# Patient Record
Sex: Female | Born: 1940 | Race: White | Hispanic: No | Marital: Married | Smoking: Former smoker
Health system: Southern US, Community
[De-identification: ages and names within clinical notes are randomized; demographics above are authoritative.]

## PROBLEM LIST (undated history)

## (undated) DIAGNOSIS — C55 Malignant neoplasm of uterus, part unspecified: Secondary | ICD-10-CM

## (undated) DIAGNOSIS — E079 Disorder of thyroid, unspecified: Secondary | ICD-10-CM

## (undated) DIAGNOSIS — C801 Malignant (primary) neoplasm, unspecified: Secondary | ICD-10-CM

## (undated) DIAGNOSIS — Z9225 Personal history of immunosupression therapy: Secondary | ICD-10-CM

## (undated) DIAGNOSIS — E785 Hyperlipidemia, unspecified: Secondary | ICD-10-CM

## (undated) DIAGNOSIS — C541 Malignant neoplasm of endometrium: Secondary | ICD-10-CM

## (undated) DIAGNOSIS — M810 Age-related osteoporosis without current pathological fracture: Secondary | ICD-10-CM

## (undated) DIAGNOSIS — I10 Essential (primary) hypertension: Secondary | ICD-10-CM

## (undated) HISTORY — PX: OOPHORECTOMY: SHX86

## (undated) HISTORY — DX: Disorder of thyroid, unspecified: E07.9

## (undated) HISTORY — DX: Malignant neoplasm of uterus, part unspecified: C55

## (undated) HISTORY — DX: Essential (primary) hypertension: I10

## (undated) HISTORY — DX: Personal history of immunosuppression therapy: Z92.25

## (undated) HISTORY — PX: ABDOMINAL HYSTERECTOMY: SHX81

## (undated) HISTORY — DX: Malignant neoplasm of endometrium: C54.1

## (undated) HISTORY — DX: Age-related osteoporosis without current pathological fracture: M81.0

## (undated) HISTORY — DX: Malignant (primary) neoplasm, unspecified: C80.1

## (undated) HISTORY — DX: Hyperlipidemia, unspecified: E78.5

---

## 2004-02-17 ENCOUNTER — Ambulatory Visit (HOSPITAL_COMMUNITY): Admission: RE | Admit: 2004-02-17 | Discharge: 2004-02-17 | Payer: Self-pay | Admitting: Gynecology

## 2004-03-13 ENCOUNTER — Ambulatory Visit: Admission: RE | Admit: 2004-03-13 | Discharge: 2004-03-13 | Payer: Self-pay | Admitting: Gynecology

## 2004-04-03 ENCOUNTER — Inpatient Hospital Stay (HOSPITAL_COMMUNITY): Admission: RE | Admit: 2004-04-03 | Discharge: 2004-04-06 | Payer: Self-pay | Admitting: Gynecology

## 2004-05-15 ENCOUNTER — Ambulatory Visit: Admission: RE | Admit: 2004-05-15 | Discharge: 2004-05-15 | Payer: Self-pay | Admitting: Gynecology

## 2004-11-23 ENCOUNTER — Ambulatory Visit: Admission: RE | Admit: 2004-11-23 | Discharge: 2004-11-23 | Payer: Self-pay | Admitting: Gynecology

## 2004-11-23 ENCOUNTER — Other Ambulatory Visit: Admission: RE | Admit: 2004-11-23 | Discharge: 2004-11-23 | Payer: Self-pay | Admitting: Gynecology

## 2005-01-23 ENCOUNTER — Ambulatory Visit: Payer: Self-pay | Admitting: Family Medicine

## 2005-06-28 ENCOUNTER — Ambulatory Visit: Admission: RE | Admit: 2005-06-28 | Discharge: 2005-06-28 | Payer: Self-pay | Admitting: Gynecology

## 2005-06-28 ENCOUNTER — Other Ambulatory Visit: Admission: RE | Admit: 2005-06-28 | Discharge: 2005-06-28 | Payer: Self-pay | Admitting: Gynecology

## 2005-10-25 ENCOUNTER — Inpatient Hospital Stay: Payer: Self-pay | Admitting: Internal Medicine

## 2005-10-25 ENCOUNTER — Other Ambulatory Visit: Payer: Self-pay

## 2005-11-04 ENCOUNTER — Ambulatory Visit: Payer: Self-pay | Admitting: Internal Medicine

## 2005-11-14 ENCOUNTER — Ambulatory Visit: Payer: Self-pay | Admitting: Internal Medicine

## 2005-12-09 ENCOUNTER — Ambulatory Visit: Payer: Self-pay | Admitting: Gastroenterology

## 2005-12-14 ENCOUNTER — Ambulatory Visit: Payer: Self-pay | Admitting: Internal Medicine

## 2006-01-14 ENCOUNTER — Ambulatory Visit: Payer: Self-pay | Admitting: Internal Medicine

## 2006-02-14 ENCOUNTER — Ambulatory Visit: Payer: Self-pay | Admitting: Internal Medicine

## 2006-03-21 ENCOUNTER — Ambulatory Visit: Payer: Self-pay | Admitting: Gastroenterology

## 2006-04-15 ENCOUNTER — Ambulatory Visit: Payer: Self-pay | Admitting: Gastroenterology

## 2007-04-08 ENCOUNTER — Ambulatory Visit: Payer: Self-pay | Admitting: Family Medicine

## 2008-03-31 ENCOUNTER — Ambulatory Visit: Payer: Self-pay | Admitting: Gastroenterology

## 2011-02-08 ENCOUNTER — Ambulatory Visit: Payer: Self-pay | Admitting: Gastroenterology

## 2011-07-26 DIAGNOSIS — I10 Essential (primary) hypertension: Secondary | ICD-10-CM | POA: Insufficient documentation

## 2011-07-26 DIAGNOSIS — M810 Age-related osteoporosis without current pathological fracture: Secondary | ICD-10-CM | POA: Insufficient documentation

## 2011-07-26 DIAGNOSIS — M069 Rheumatoid arthritis, unspecified: Secondary | ICD-10-CM | POA: Insufficient documentation

## 2011-07-26 DIAGNOSIS — K5090 Crohn's disease, unspecified, without complications: Secondary | ICD-10-CM | POA: Insufficient documentation

## 2011-07-26 DIAGNOSIS — E039 Hypothyroidism, unspecified: Secondary | ICD-10-CM | POA: Insufficient documentation

## 2011-07-26 DIAGNOSIS — E7800 Pure hypercholesterolemia, unspecified: Secondary | ICD-10-CM | POA: Insufficient documentation

## 2011-08-13 ENCOUNTER — Ambulatory Visit: Payer: Self-pay | Admitting: Family Medicine

## 2011-09-13 ENCOUNTER — Ambulatory Visit: Payer: Self-pay | Admitting: Gastroenterology

## 2011-10-03 ENCOUNTER — Other Ambulatory Visit: Payer: Self-pay | Admitting: Rheumatology

## 2011-10-29 ENCOUNTER — Ambulatory Visit: Payer: Self-pay | Admitting: Gastroenterology

## 2012-08-12 ENCOUNTER — Other Ambulatory Visit: Payer: Self-pay | Admitting: Gastroenterology

## 2012-08-12 DIAGNOSIS — K562 Volvulus: Secondary | ICD-10-CM

## 2012-08-26 ENCOUNTER — Ambulatory Visit
Admission: RE | Admit: 2012-08-26 | Discharge: 2012-08-26 | Disposition: A | Discharge status: Home / Self Care | Payer: Medicare Other | Source: Ambulatory Visit | Attending: Gastroenterology | Admitting: Gastroenterology

## 2012-08-26 DIAGNOSIS — K562 Volvulus: Secondary | ICD-10-CM

## 2012-08-31 DIAGNOSIS — Z9225 Personal history of immunosupression therapy: Secondary | ICD-10-CM | POA: Insufficient documentation

## 2012-08-31 DIAGNOSIS — R739 Hyperglycemia, unspecified: Secondary | ICD-10-CM | POA: Insufficient documentation

## 2012-08-31 DIAGNOSIS — N390 Urinary tract infection, site not specified: Secondary | ICD-10-CM | POA: Insufficient documentation

## 2012-08-31 DIAGNOSIS — R748 Abnormal levels of other serum enzymes: Secondary | ICD-10-CM | POA: Insufficient documentation

## 2013-07-12 DIAGNOSIS — K219 Gastro-esophageal reflux disease without esophagitis: Secondary | ICD-10-CM | POA: Insufficient documentation

## 2016-04-09 ENCOUNTER — Telehealth: Payer: Self-pay

## 2016-04-09 NOTE — Telephone Encounter (Signed)
lmov to schedule lbpu consultation appt per paper referral from [facility]

## 2016-05-15 ENCOUNTER — Encounter: Payer: Self-pay | Admitting: Internal Medicine

## 2016-05-15 ENCOUNTER — Other Ambulatory Visit: Payer: Self-pay | Admitting: *Deleted

## 2016-05-15 DIAGNOSIS — D510 Vitamin B12 deficiency anemia due to intrinsic factor deficiency: Secondary | ICD-10-CM | POA: Insufficient documentation

## 2016-05-15 DIAGNOSIS — K5790 Diverticulosis of intestine, part unspecified, without perforation or abscess without bleeding: Secondary | ICD-10-CM | POA: Insufficient documentation

## 2016-05-15 DIAGNOSIS — K259 Gastric ulcer, unspecified as acute or chronic, without hemorrhage or perforation: Secondary | ICD-10-CM | POA: Insufficient documentation

## 2016-05-15 DIAGNOSIS — K5090 Crohn's disease, unspecified, without complications: Secondary | ICD-10-CM | POA: Insufficient documentation

## 2016-05-15 DIAGNOSIS — G629 Polyneuropathy, unspecified: Secondary | ICD-10-CM | POA: Insufficient documentation

## 2016-05-15 DIAGNOSIS — E039 Hypothyroidism, unspecified: Secondary | ICD-10-CM | POA: Insufficient documentation

## 2016-05-15 DIAGNOSIS — M059 Rheumatoid arthritis with rheumatoid factor, unspecified: Secondary | ICD-10-CM | POA: Insufficient documentation

## 2016-05-20 ENCOUNTER — Ambulatory Visit (INDEPENDENT_AMBULATORY_CARE_PROVIDER_SITE_OTHER): Payer: Medicare HMO | Admitting: Internal Medicine

## 2016-05-20 ENCOUNTER — Encounter: Payer: Self-pay | Admitting: Internal Medicine

## 2016-05-20 ENCOUNTER — Ambulatory Visit
Admission: RE | Admit: 2016-05-20 | Discharge: 2016-05-20 | Disposition: A | Discharge status: Home / Self Care | Payer: Medicare HMO | Source: Ambulatory Visit | Attending: Internal Medicine | Admitting: Internal Medicine

## 2016-05-20 VITALS — BP 100/64 | HR 73 | Resp 16 | Ht 60.0 in | Wt 158.0 lb

## 2016-05-20 DIAGNOSIS — R0600 Dyspnea, unspecified: Secondary | ICD-10-CM

## 2016-05-20 DIAGNOSIS — R0689 Other abnormalities of breathing: Secondary | ICD-10-CM | POA: Diagnosis not present

## 2016-05-20 DIAGNOSIS — I700 Atherosclerosis of aorta: Secondary | ICD-10-CM | POA: Insufficient documentation

## 2016-05-20 MED ORDER — UMECLIDINIUM-VILANTEROL 62.5-25 MCG/INH IN AEPB: 1.0000 | INHALATION_SPRAY | Freq: Every day | RESPIRATORY_TRACT | 0 refills | Status: DC

## 2016-05-20 MED ORDER — UMECLIDINIUM-VILANTEROL 62.5-25 MCG/INH IN AEPB: 1.0000 | INHALATION_SPRAY | Freq: Every day | RESPIRATORY_TRACT | 5 refills | Status: DC

## 2016-05-20 NOTE — Patient Instructions (Addendum)
--  Will check CXR 2 view.  --Will check PFT.  --Will start Anoro inhaler --Check CBC with differential and basic metabolic panel.

## 2016-05-20 NOTE — Progress Notes (Signed)
[facility] Consultation      Assessment and Plan:  76 year old former smoking female with dyspnea on exertion of uncertain etiology.  Dyspnea on exertion. -Patient has scattered, very subtle crackles bilaterally, she has a history of rheumatoid arthritis on methotrexate, therefore, probably fibrosis would be in the differential. 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 -We'll start empirically on an oral inhaler once daily. -We'll check a lung function test, chest x-ray, CBC with differential. -The patient was ambulated in the clinic today, her gait seems stable, but uncertain, would consider referral to physical therapy if not improving. -Patient is very sedentary, would recommend increasing her physical activity level.  Date: 05/20/2016  MRN# [id number] Yvonne Newman 1940/01/16  Referring Physician: NP Dolleschel for dyspnea.   Yvonne Newman is a 76 y.o. old female seen in consultation for chief complaint of:    Chief Complaint  Patient presents with  . Advice Only    pt denies cough/wheeze/chest pain. She experiences sob with exhertion for about 1 year now.  . Shortness of Breath    HPI:   The patient is a 76 year old female referred for progressive dyspnea on exertion. She has been referred to pulmonary for further evaluation of her dyspnea. There are currently no images available for review, there is an report of an outside chest x-ray done on 10/25/2005, which showed some left lung base atelectasis, but otherwise unremarkable.  She has noted that she gets winded with stairs, or carrying groceries. This started about 1-2 years ago, it was actually worse in feb. There was no exacerbating or relieving factors.  She has arthritis in her feet and hands and has been diagnosed with RA.  She takes methotrexate, on the pills for about 20 years, and injection for about 2-3 years she thinks. She sees Dr.  Jefm Bryant.   She used to work in a [employer], for more than 30 years, and retired about 10 years ago. She has not been very active. She goes to [employer] and can walk the whole store but slowly.   She last smoked about 10 yrs ago, about half ppd. Her husband is a current smoker. She has a dog in bedroom, not in bed.   She has never been on an inhaler. She has occasional reflux about once per week, takes omeprazole prn, which takes of it. She has allergies and has stuffy nose usually in the am, she takes nothing for it, usually goes away during the day.   **Review of outside records at [facility]. The patient has been having progressive shortness of breath with activities of daily living, she has a history of hypertension, hyperlipidemia, hypothyroidism, uterine/endometrial cancer at the age of 45.  **Desat walk on RA at rest sat was 97% and HR 90; after 180 feet sat was 90% and HR 90.  After 350 feet sat was 94% and HR 107 on RA, mild dyspnea. Gait somewhat shuffling.    PMHX:   Past Medical History:  Diagnosis Date  . Cancer ([place])   . Endometrial cancer ([place])   . History of immunosuppression therapy   . Hyperlipidemia   . Hypertension   . Osteoporosis   . Thyroid disease   . Uterine cancer [facility])    Surgical Hx:  Past Surgical History:  Procedure Laterality Date  . ABDOMINAL HYSTERECTOMY    . CESAREAN SECTION    . OOPHORECTOMY     Family Hx:  Family History  Problem Relation Age of Onset  . Stroke Mother   . Melanoma Sister   . Breast cancer Sister    Social Hx:   Social History  Substance Use Topics  . Smoking status: Former Research scientist (life sciences)  . Smokeless tobacco: Never Used  . Alcohol use No   Medication:   Reviewed, includes omeprazole, methotrexate.   Allergies:  Ciprofloxacin; Penicillins; Statins; Cephalexin; Other; and Sulfa antibiotics  Review of Systems: Gen:  Denies  fever, sweats, chills HEENT: Denies blurred vision, double vision. bleeds, sore throat Cvc:  No  dizziness, chest pain. Resp:   Denies cough or sputum production, shortness of breath Gi: Denies swallowing difficulty, stomach pain. Gu:  Denies bladder incontinence, burning urine Ext:   No Joint pain, stiffness. Skin: No skin rash,  hives  Endoc:  No polyuria, polydipsia. Psych: No depression, insomnia. Other:  All other systems were reviewed with the patient and were negative other that what is mentioned in the HPI.   Physical Examination:   VS: BP 100/64 (BP Location: Left Arm, Patient Position: Sitting, Cuff Size: Normal)   Pulse 73   Resp 16   Ht 5' (1.524 m)   Wt 158 lb (71.7 kg)   SpO2 97%   BMI 30.86 kg/m   General Appearance: No distress  Neuro:without focal findings,  speech normal,  HEENT: PERRLA, EOM intact.   Pulmonary: normal breath sounds, No wheezing. Scattered bibasilar creps.  CardiovascularNormal S1,S2.  No m/r/g.   Abdomen: Benign, Soft, non-tender. Renal:  No costovertebral tenderness  GU:  No performed at this time. Endoc: No evident thyromegaly, no signs of acromegaly. Skin:   warm, no rashes, no ecchymosis  Extremities: normal, no cyanosis, clubbing.  Other findings:    LABORATORY PANEL:   CBC No results for input(s): WBC, HGB, HCT, PLT in the last 168 hours. ------------------------------------------------------------------------------------------------------------------  Chemistries  No results for input(s): NA, K, CL, CO2, GLUCOSE, BUN, CREATININE, CALCIUM, MG, AST, ALT, ALKPHOS, BILITOT in the last 168 hours.  Invalid input(s): GFRCGP ------------------------------------------------------------------------------------------------------------------  Cardiac Enzymes No results for input(s): TROPONINI in the last 168 hours. ------------------------------------------------------------  RADIOLOGY:  No results found.     Thank  you for the consultation and for allowing [facility] to assist in the care of your  patient. Our recommendations are noted above.  Please contact us if we can be of further service.   Marda Stalker, MD.  Board Certified in Internal Medicine, Pulmonary Medicine, [place], and Sleep Medicine.  [facility] Office Number: [phone]  Patricia Pesa, M.D.  Merton Border, M.D  05/20/2016

## 2016-09-03 ENCOUNTER — Ambulatory Visit: Payer: Medicare HMO | Attending: Internal Medicine

## 2016-09-03 ENCOUNTER — Ambulatory Visit: Payer: Medicare HMO

## 2016-09-03 DIAGNOSIS — R0600 Dyspnea, unspecified: Secondary | ICD-10-CM | POA: Diagnosis not present

## 2016-09-03 DIAGNOSIS — R0689 Other abnormalities of breathing: Secondary | ICD-10-CM | POA: Diagnosis not present

## 2016-09-04 NOTE — Progress Notes (Signed)
[facility]     Assessment and Plan:  76 year old former smoking female with dyspnea on exertion , likely due to mild emphysema, deconditioning.   Dyspnea on exertion. -Suspect due to obesity, deconditioning, emphysema.  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 --CBC with differential was never done will reorder if pt's symptoms recur.   COPD/emphysema. -Seen on PFT, with presence of hyperinflation consistent with emphysema. --Pt's breathing appears to be doing well on it's own, does not want to take any inhaler, therefore will hold off.   Date: 09/04/2016  MRN# [id number] Yvonne Newman 12/26/1940  Referring Physician: NP Dolleschel for dyspnea.   Yvonne Newman is a 76 y.o. old female seen in consultation for chief complaint of:    Chief Complaint  Patient presents with  . Follow-up    57mo rov- review PFT results.     HPI:   The patient is a 76 year old female referred for progressive dyspnea on exertion. At last visit it was noted that she had progressive dyspnea over the last 1-2 years. She has a diagnosis of rheumatoid arthritis on methotrexate, following with Dr. Jefm Bryant. She was noted to be a former smoker. At last visit, she was sent for CBC with differential, PFT, chest x-ray. She was started on Anoro empirically. She is not a smoker but her husband does smoke. She is not on Anoro, she got a UTI and she is convinced it was caused by the Anoro. She did not get the blood work done.   **PFT tracings personally reviewed; PFT 09/03/2016; FVC is 90% of predicted, FEV1 is 90% of predicted. Ratio 76%. No bronchodilator was given. TLC is 135%, RV to TLC ratio is elevated, consistent with hyperinflation. Diffusion capacity is 69%. Overall this test is consistent with hyperinflation from emphysema. **Chest x-ray images personally reviewed, chest x-ray 05/20/2016; bibasilar atelectasis, changes of chronic  bronchitis, hyperinflation consistent with emphysema. **Review of outside records at [facility]. The patient has been having progressive shortness of breath with activities of daily living, she has a history of hypertension, hyperlipidemia, hypothyroidism, uterine/endometrial cancer at the age of 26. **Desat walk on RA at rest sat was 97% and HR 90; after 180 feet sat was 90% and HR 90.  After 350 feet sat was 94% and HR 107 on RA, mild dyspnea. Gait somewhat shuffling.   Medication:    Current Outpatient Prescriptions:  .  Calcium Carbonate-Vitamin D 600-400 MG-UNIT tablet, Take 1 tablet by mouth 2 (two) times daily., Disp: , Rfl:  .  Cyanocobalamin (B-12) 1000 MCG CAPS, Take 1 capsule by mouth daily., Disp: , Rfl:  .  DOCOSAHEXAENOIC ACID PO, Take 1 g by mouth 2 (two) times daily., Disp: , Rfl:  .  folic acid (FOLVITE) 195 MCG tablet, Take 0.4 mg by mouth daily., Disp: , Rfl:  .  Insulin Syringe-Needle U-100 (B-D INS SYR ULTRAFINE 1CC/30G) 30G X 1/2" 1 ML MISC, 1 each by Other route daily., Disp: , Rfl:  .  levothyroxine (SYNTHROID, LEVOTHROID) 88 MCG tablet, Take 88 mcg by mouth daily., Disp: , Rfl:  .  lisinopril (PRINIVIL,ZESTRIL) 20 MG tablet, Take 20 mg by mouth daily., Disp: , Rfl:  .  Methotrexate Sodium (METHOTREXATE, PF,) 50 MG/2ML injection, Inject 20 mg into the skin as needed., Disp: , Rfl:  .  Multiple Vitamin (MULTI-VITAMINS) TABS, Take 1 tablet by mouth daily., Disp: , Rfl:  .  omeprazole (PRILOSEC) 20 MG capsule, Take  20 mg by mouth daily., Disp: , Rfl:  .  vitamin E 400 UNIT capsule, Take 400 Units by mouth daily., Disp: , Rfl:    Allergies:  Ciprofloxacin; Penicillins; Statins; Cephalexin; Other; and Sulfa antibiotics  Review of Systems: Gen:  Denies  fever, sweats, chills HEENT: Denies blurred vision, double vision. bleeds, sore throat Cvc:  No dizziness, chest pain. Resp:   Denies cough or sputum production, shortness of breath Gi: Denies swallowing difficulty, stomach  pain. Gu:  Denies bladder incontinence, burning urine Ext:   No Joint pain, stiffness. Skin: No skin rash,  hives  Endoc:  No polyuria, polydipsia. Psych: No depression, insomnia. Other:  All other systems were reviewed with the patient and were negative other that what is mentioned in the HPI.   Physical Examination:   VS: BP 118/68 (BP Location: Left Arm, Cuff Size: Normal)   Pulse 96   Ht 5\' 2"  (1.575 m)   Wt 158 lb (71.7 kg)   SpO2 97%   BMI 28.90 kg/m   General Appearance: No distress  Neuro:without focal findings,  speech normal,  HEENT: PERRLA, EOM intact.   Pulmonary: normal breath sounds, No wheezing. Scattered bibasilar creps.  CardiovascularNormal S1,S2.  No m/r/g.   Abdomen: Benign, Soft, non-tender. Renal:  No costovertebral tenderness  GU:  No performed at this time. Endoc: No evident thyromegaly, no signs of acromegaly. Skin:   warm, no rashes, no ecchymosis  Extremities: normal, no cyanosis, clubbing.  Other findings:    LABORATORY PANEL:   CBC No results for input(s): WBC, HGB, HCT, PLT in the last 168 hours. ------------------------------------------------------------------------------------------------------------------  Chemistries  No results for input(s): NA, K, CL, CO2, GLUCOSE, BUN, CREATININE, CALCIUM, MG, AST, ALT, ALKPHOS, BILITOT in the last 168 hours.  Invalid input(s): GFRCGP ------------------------------------------------------------------------------------------------------------------  Cardiac Enzymes No results for input(s): TROPONINI in the last 168 hours. ------------------------------------------------------------  RADIOLOGY:  No results found.     Thank  you for the consultation and for allowing [facility] to assist in the care of your patient. Our recommendations are noted above.  Please contact us if we can be of further service.   Marda Stalker, MD.  Board Certified in Internal Medicine,  Pulmonary Medicine, [place], and Sleep Medicine.  [facility] Office Number: [phone]  Patricia Pesa, M.D.  Merton Border, M.D  09/04/2016

## 2016-09-10 ENCOUNTER — Ambulatory Visit (INDEPENDENT_AMBULATORY_CARE_PROVIDER_SITE_OTHER): Payer: Medicare HMO | Admitting: Internal Medicine

## 2016-09-10 ENCOUNTER — Encounter: Payer: Self-pay | Admitting: Internal Medicine

## 2016-09-10 VITALS — BP 118/68 | HR 96 | Ht 62.0 in | Wt 158.0 lb

## 2016-09-10 DIAGNOSIS — J449 Chronic obstructive pulmonary disease, unspecified: Secondary | ICD-10-CM

## 2016-09-10 NOTE — Patient Instructions (Signed)
--  The best thing that you can do for your lungs is to be as active as possible and to avoid smoke.

## 2018-07-27 ENCOUNTER — Other Ambulatory Visit: Payer: Self-pay | Admitting: Rheumatology

## 2018-07-27 DIAGNOSIS — M5416 Radiculopathy, lumbar region: Secondary | ICD-10-CM

## 2018-08-07 ENCOUNTER — Ambulatory Visit
Admission: RE | Admit: 2018-08-07 | Discharge: 2018-08-07 | Disposition: A | Discharge status: Home / Self Care | Payer: Medicare HMO | Source: Ambulatory Visit | Attending: Rheumatology | Admitting: Rheumatology

## 2018-08-07 ENCOUNTER — Other Ambulatory Visit: Payer: Self-pay

## 2018-08-07 DIAGNOSIS — M5416 Radiculopathy, lumbar region: Secondary | ICD-10-CM | POA: Diagnosis not present

## 2020-01-28 IMAGING — MR MRI LUMBAR SPINE WITHOUT CONTRAST
4 of 5 series · 33 of 48 positions shown · non-contrast
Comparison: None.

CLINICAL DATA: Right leg pain for approximately 2 months. No known
injury.

EXAM:
MRI LUMBAR SPINE WITHOUT CONTRAST
TECHNIQUE: Multiplanar, multisequence MR imaging of the lumbar spine was
performed. No intravenous contrast was administered.

[Series 5: T2 · sagittal · 4.0mm · 0.81mm/px · 8 of 17 slices shown (1 of 2)]
[im 1/17]
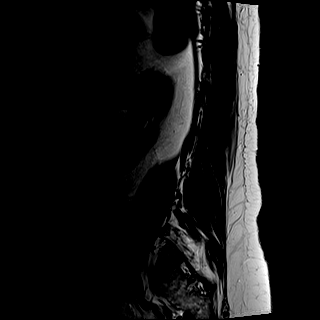
[im 3/17]
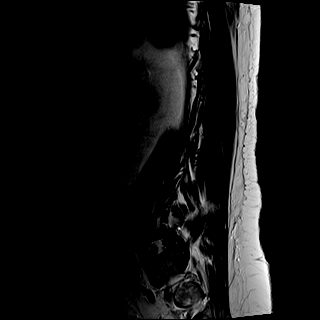
[im 5/17]
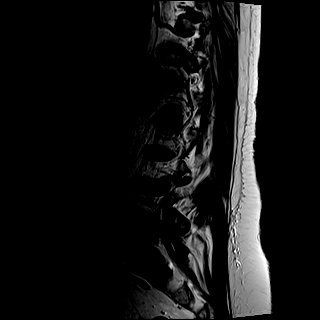
[im 7/17]
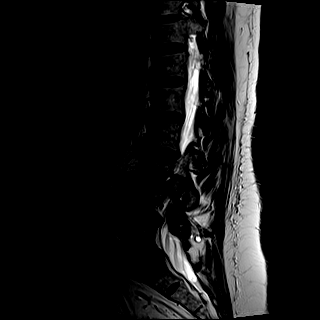
[im 10/17]
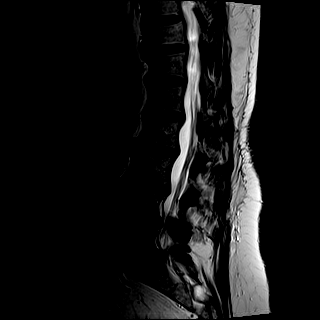
[im 12/17]
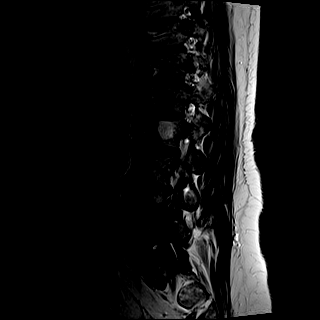
[im 14/17]
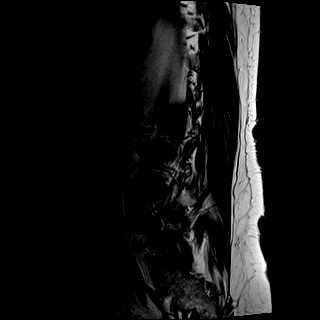
[im 17/17]
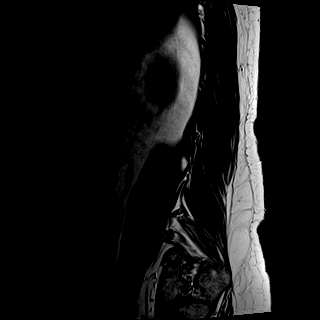

[Series 6: T1 · sagittal · 4.0mm · 0.81mm/px · 7 of 17 slices shown (1 of 2)]
[im 1/17]
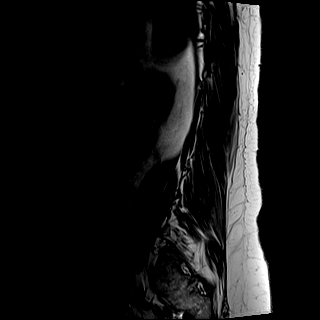
[im 3/17]
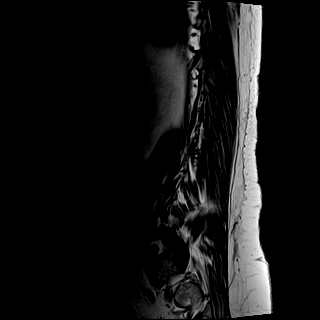
[im 6/17]
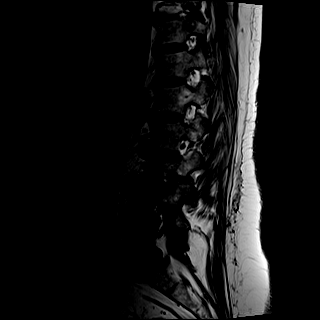
[im 9/17]
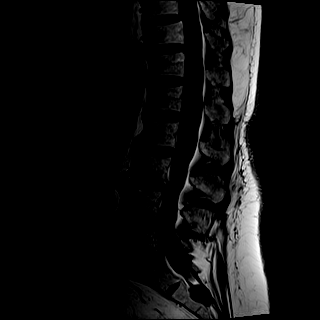
[im 11/17]
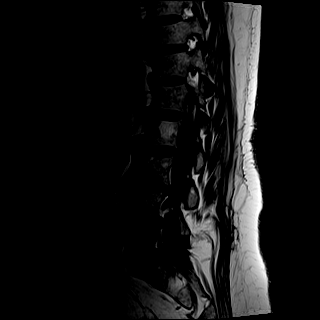
[im 14/17]
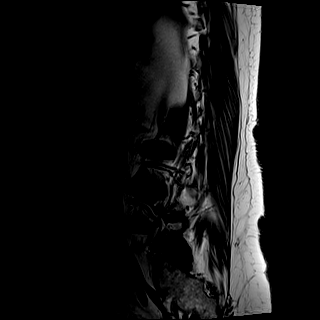
[im 17/17]
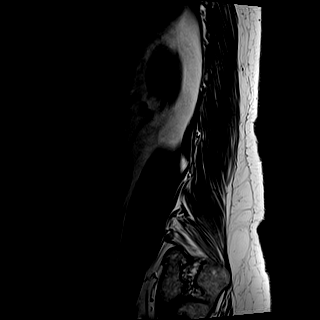

[Series 8: T2 · axial · 4.0mm · 0.78mm/px · z∈[-30,+148]mm · 9 of 32 slices shown (2 of 2)]
[im 1/32]
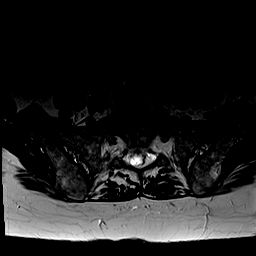
[im 6/32]
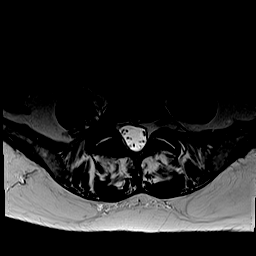
[im 11/32]
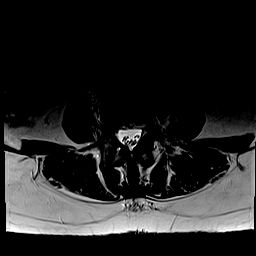
[im 13/32]
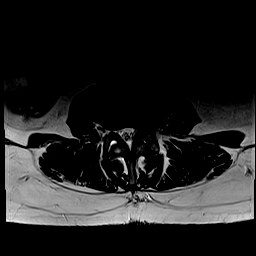
[im 16/32]
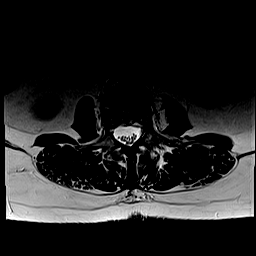
[im 19/32]
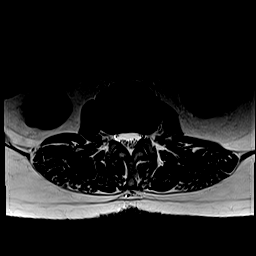
[im 21/32]
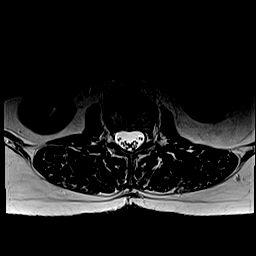
[im 26/32]
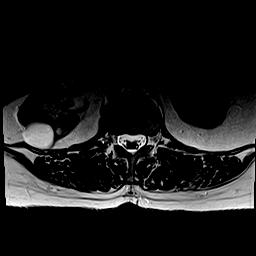
[im 32/32]
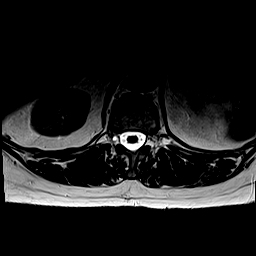

[Series 9: T1 · axial · 4.0mm · 0.39mm/px · z∈[-30,+148]mm · 9 of 32 slices shown (2 of 2)]
[im 1/32]
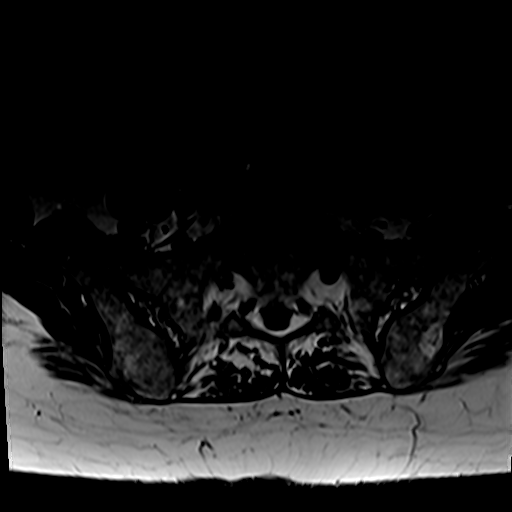
[im 6/32]
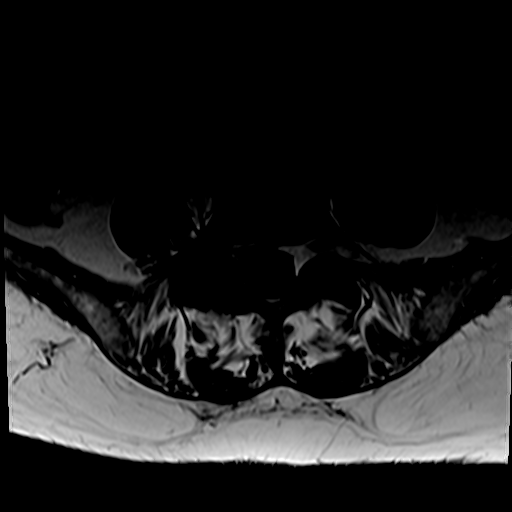
[im 11/32]
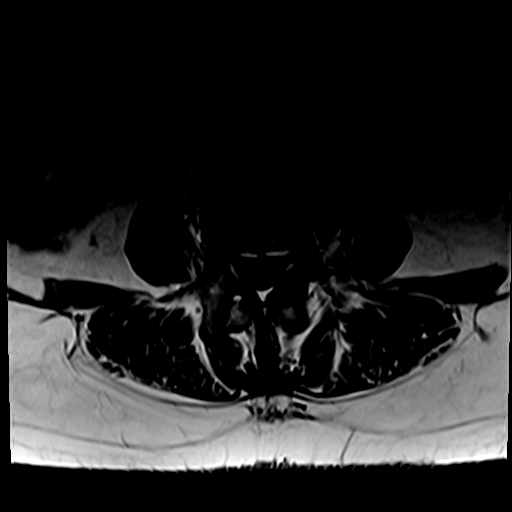
[im 13/32]
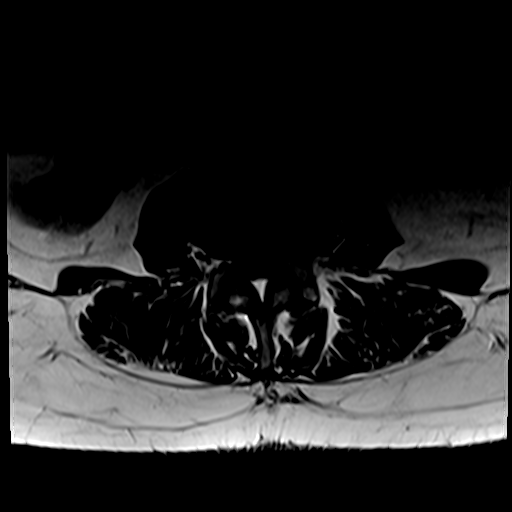
[im 16/32]
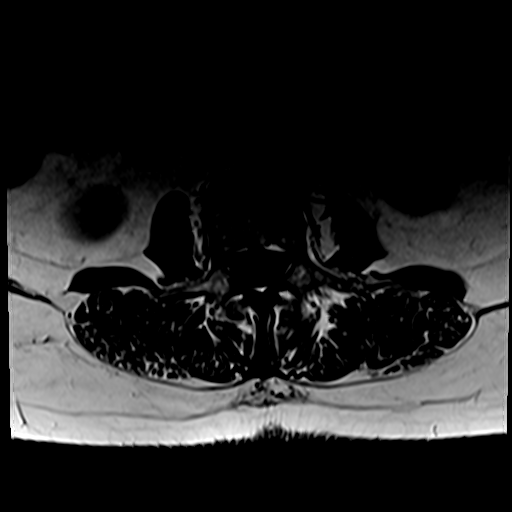
[im 19/32]
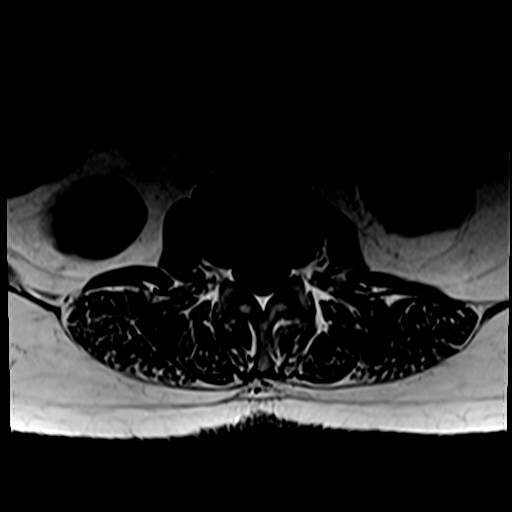
[im 21/32]
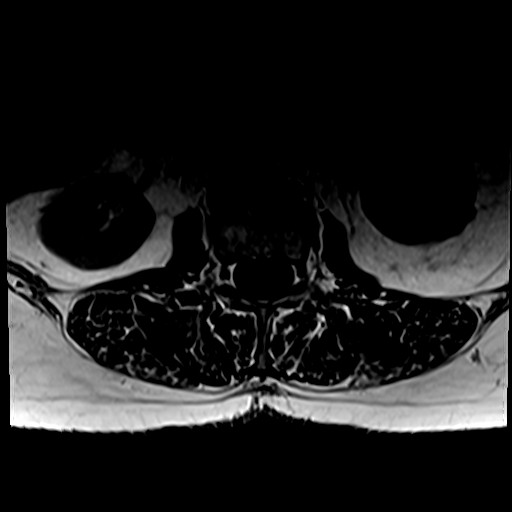
[im 26/32]
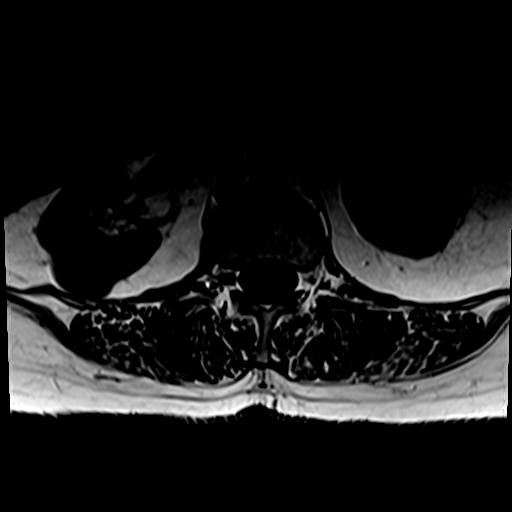
[im 32/32]
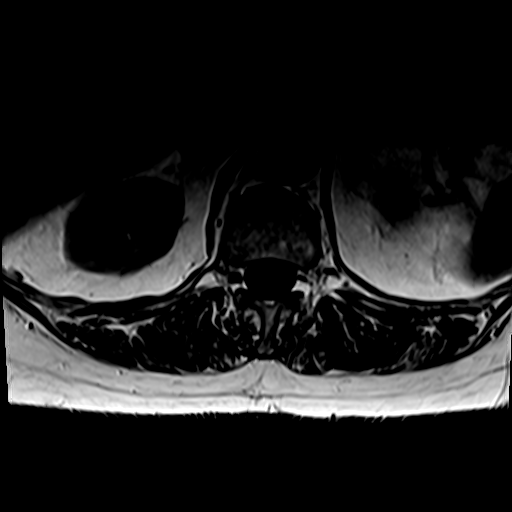

[33 of 48 positions shown; findings below may reference images not displayed]

FINDINGS: Segmentation:  Standard.

Alignment: Trace retrolisthesis L2 on L3, L3 on L4 and L4 on L5.
There is also trace anterolisthesis L5 on S1.

Vertebrae: No fracture or worrisome lesion. Hemangioma in L2 noted.
Small Schmorl's nodes in the L2-3 endplates are seen.

Conus medullaris and cauda equina: Conus extends to the L2 level.
Conus and cauda equina appear normal.

Paraspinal and other soft tissues: Bilateral renal cysts are
identified.

Disc levels:

T10-11 and T11-12 are imaged in the sagittal plane only. Shallow
central protrusion is seen at T11-12 without stenosis. T10-11 is
negative.

T12-L1: Minimal disc bulge without stenosis.

L1-2: Shallow central protrusion without stenosis.

L2-3: Shallow disc bulge without stenosis.

L3-4: The patient has a shallow disc bulge with more focally
protruding disc in the left subarticular recess and foramen. There
is moderately severe left foraminal narrowing which could impact the
exiting left L3 root. Mild narrowing in the left subarticular recess
is also seen. Right foramen is open.

L4-5: Moderate facet arthropathy is worse on the right. Shallow disc
bulge is seen with a superimposed right foraminal protrusion. Severe
right foraminal narrowing and encroachment on the exiting right L4
root are identified. There is also narrowing in the right
subarticular recess by disc which could impact the descending right
L5 root. Mild to moderate left foraminal stenosis and mild central
canal narrowing overall.

L5-S1: Moderately severe bilateral facet degenerative change.
Shallow disc bulge. The central canal and left foramen are widely
patent. Moderate right foraminal narrowing.
IMPRESSION: Given right lower extremity symptoms, dominant findings are at L4-5
where protruding disc in the right foramen and facet arthropathy
cause marked right foraminal narrowing and encroachment on the
exiting right L4 root. Disc could also impact the descending right
L5 root in the subarticular recess at this level.

Moderately severe left foraminal narrowing at L3-4 with encroachment
on the exiting left L3 root. There is also mild left subarticular
recess stenosis at this level.

Multilevel facet degenerative disease appears worst at L5-S1.

## 2021-11-05 ENCOUNTER — Other Ambulatory Visit: Payer: Self-pay | Admitting: Rheumatology

## 2021-11-05 DIAGNOSIS — J849 Interstitial pulmonary disease, unspecified: Secondary | ICD-10-CM

## 2021-11-05 DIAGNOSIS — R9389 Abnormal findings on diagnostic imaging of other specified body structures: Secondary | ICD-10-CM

## 2021-11-05 DIAGNOSIS — M0579 Rheumatoid arthritis with rheumatoid factor of multiple sites without organ or systems involvement: Secondary | ICD-10-CM

## 2021-11-14 ENCOUNTER — Other Ambulatory Visit: Payer: Medicare HMO

## 2021-11-20 ENCOUNTER — Other Ambulatory Visit: Payer: Medicare HMO
